# Patient Record
Sex: Male | Born: 1935 | Race: White | Hispanic: No
Health system: Southern US, Community
[De-identification: ages and names within clinical notes are randomized; demographics above are authoritative.]

---

## 2005-01-19 ENCOUNTER — Other Ambulatory Visit: Payer: Self-pay

## 2005-01-19 ENCOUNTER — Inpatient Hospital Stay: Payer: Self-pay | Admitting: Internal Medicine

## 2005-01-20 ENCOUNTER — Other Ambulatory Visit: Payer: Self-pay

## 2005-05-05 ENCOUNTER — Ambulatory Visit: Payer: Self-pay | Admitting: Cardiology

## 2006-08-06 IMAGING — CT CT OF THE RIGHT SHOULDER WITHOUT CONTRAST
1 series · 12 of 14 positions shown, 15 images · non-contrast
Comparison: none

REASON FOR EXAM: Right shoulder pain, status post pacemaker
COMMENTS:

[Series 2: inspace · axial · 0.44mm/px · z∈[-198,-72]mm · 12 of 188 slices shown, 15 images]
[im 15/188  soft-tissue]
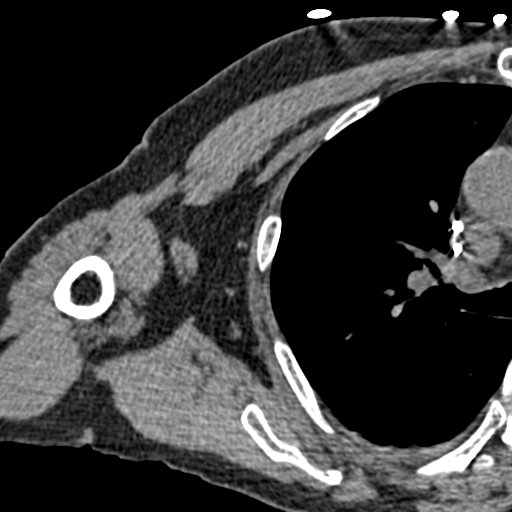
[im 15/188  bone]
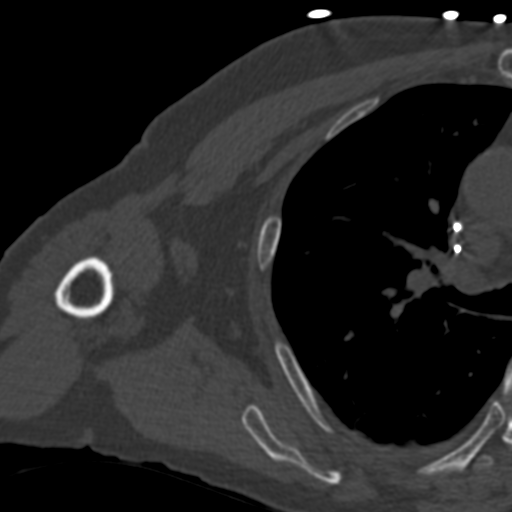
[im 29/188  bone]
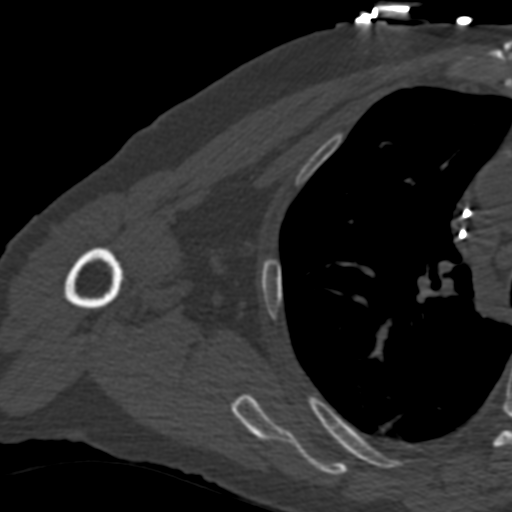
[im 44/188  bone]
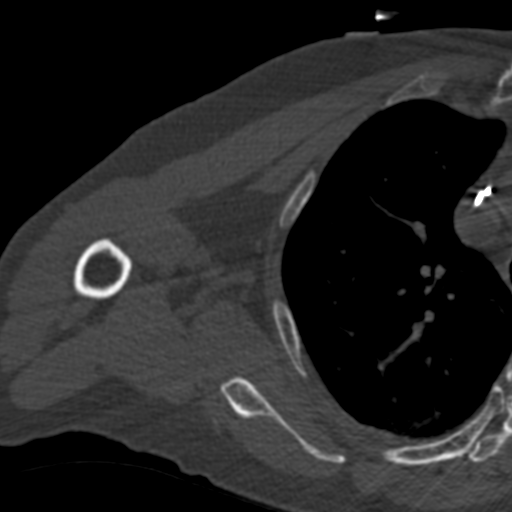
[im 58/188  bone]
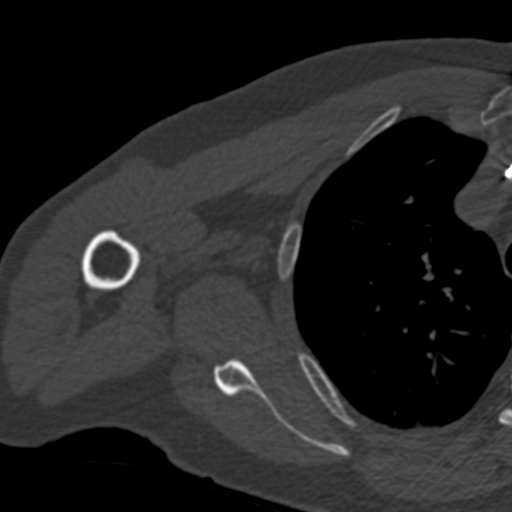
[im 72/188  soft-tissue]
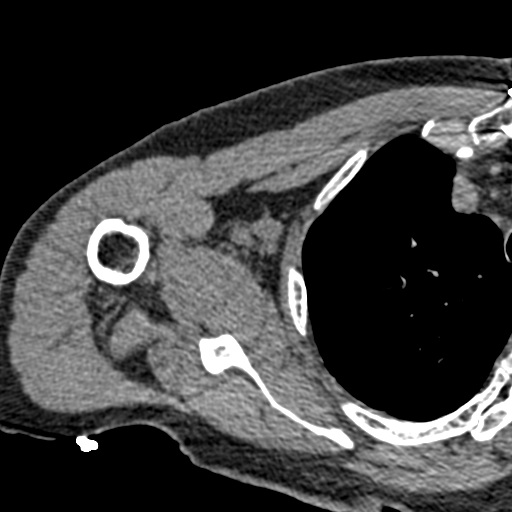
[im 72/188  bone]
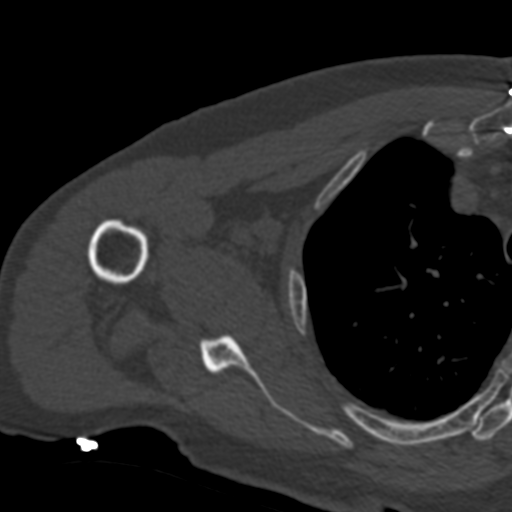
[im 87/188  bone]
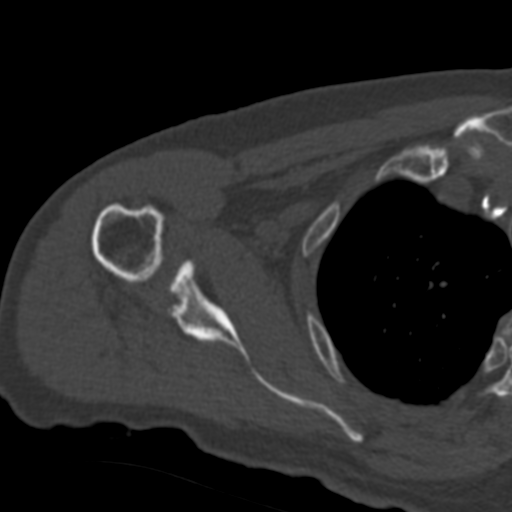
[im 101/188  bone]
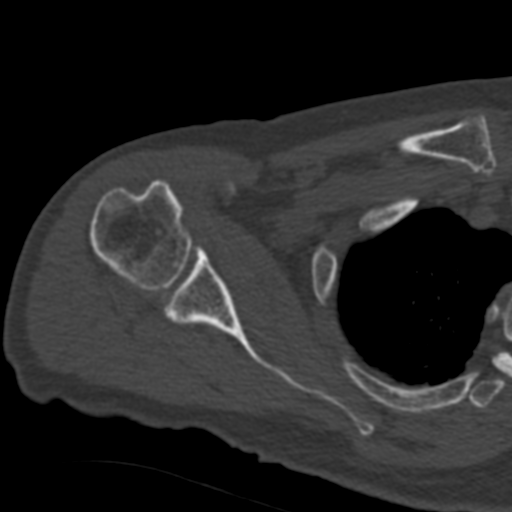
[im 116/188  bone]
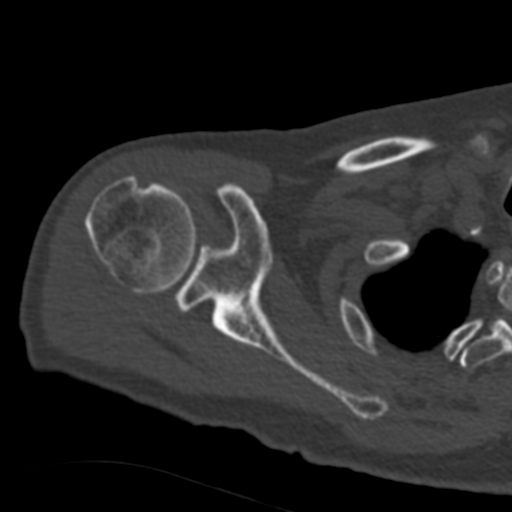
[im 130/188  soft-tissue]
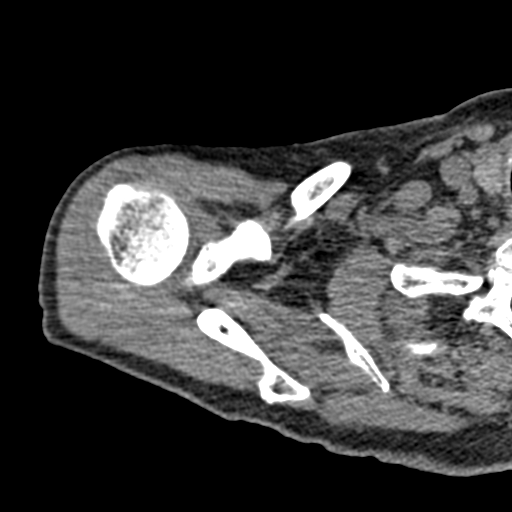
[im 130/188  bone]
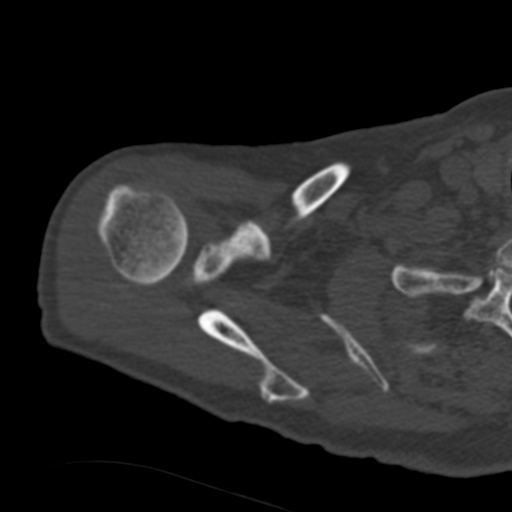
[im 144/188  bone]
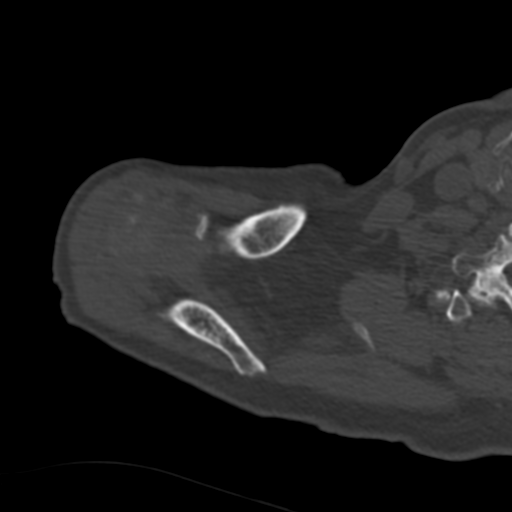
[im 159/188  bone]
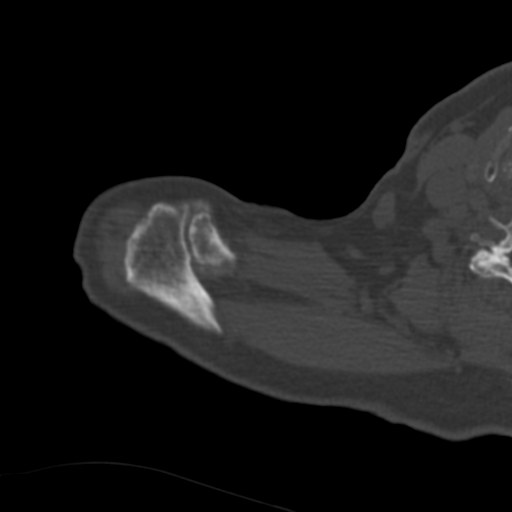
[im 173/188  bone]
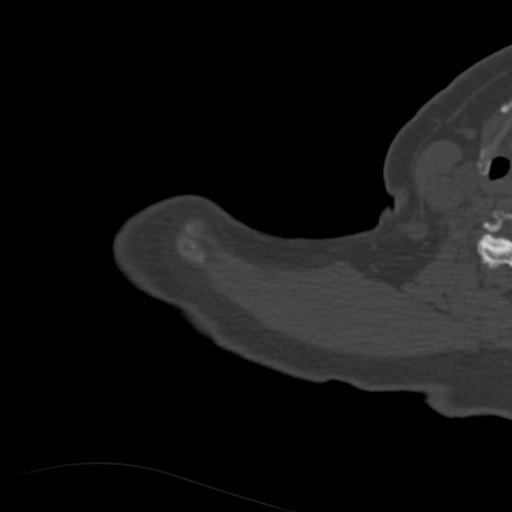

[12 of 14 positions shown; findings below may reference images not displayed]

PROCEDURE:     CT  - CT SHOULDER RIGHT WO  - January 20, 2005  [DATE]

RESULT:     Noncontrast CT of RIGHT shoulder, utilizing a multislice helical
acquisition with coronal, sagittal and axial reconstructions, is performed
utilizing bone window settings.

There are degenerative changes at the acromioclavicular joint. No definite
fracture or focal bony lesion is seen. There is no evidence of dislocation
of the humeral head from the glenoid. The clavicle appears to be intact. The
scapula appears to be normal. The adjacent ribs are unremarkable. There are
some areas of atelectasis versus linear fibrosis in the RIGHT lung.
IMPRESSION: Osteopenic and degenerative changes about the RIGHT shoulder
but no evidence of an acute bony abnormality. The possibility of a
supraspinatus tendon abnormality cannot be excluded by CT. Should the
patient's symptoms persist, shoulder arthrogram could be considered given
that the patient has a pacemaker and MR is not an option.

## 2006-11-17 ENCOUNTER — Ambulatory Visit: Payer: Self-pay | Admitting: Physician Assistant

## 2006-11-29 ENCOUNTER — Ambulatory Visit: Payer: Self-pay | Admitting: Unknown Physician Specialty

## 2006-12-23 ENCOUNTER — Ambulatory Visit: Payer: Self-pay | Admitting: Pain Medicine

## 2007-01-12 ENCOUNTER — Ambulatory Visit: Payer: Self-pay | Admitting: Pain Medicine

## 2007-12-08 ENCOUNTER — Ambulatory Visit: Payer: Self-pay | Admitting: General Surgery

## 2007-12-13 ENCOUNTER — Ambulatory Visit: Payer: Self-pay | Admitting: General Surgery

## 2010-07-03 ENCOUNTER — Ambulatory Visit: Payer: Self-pay | Admitting: Internal Medicine

## 2011-01-14 ENCOUNTER — Ambulatory Visit: Payer: Self-pay | Admitting: Internal Medicine

## 2011-07-20 ENCOUNTER — Ambulatory Visit: Payer: Self-pay | Admitting: Internal Medicine

## 2011-08-07 ENCOUNTER — Ambulatory Visit: Payer: Self-pay | Admitting: Internal Medicine

## 2011-08-18 ENCOUNTER — Ambulatory Visit: Payer: Self-pay | Admitting: Internal Medicine

## 2011-08-18 ENCOUNTER — Inpatient Hospital Stay: Payer: Self-pay | Admitting: Internal Medicine

## 2011-08-18 LAB — BASIC METABOLIC PANEL
Anion Gap: 18 — ABNORMAL HIGH (ref 7–16)
BUN: 66 mg/dL — ABNORMAL HIGH (ref 7–18)
Glucose: 91 mg/dL (ref 65–99)
Osmolality: 315 (ref 275–301)
Potassium: 3.9 mmol/L (ref 3.5–5.1)
Sodium: 149 mmol/L — ABNORMAL HIGH (ref 136–145)

## 2011-08-18 LAB — CBC CANCER CENTER
Basophil #: 0.1 x10 3/mm (ref 0.0–0.1)
Basophil %: 1 %
Eosinophil %: 5.7 %
HCT: 34.8 % — ABNORMAL LOW (ref 40.0–52.0)
Lymphocyte #: 0.9 x10 3/mm — ABNORMAL LOW (ref 1.0–3.6)
Lymphocyte %: 16.3 %
Monocyte %: 15 %
Neutrophil #: 3.2 x10 3/mm (ref 1.4–6.5)
Platelet: 106 x10 3/mm — ABNORMAL LOW (ref 150–440)
RDW: 18.1 % — ABNORMAL HIGH (ref 11.5–14.5)
WBC: 5.3 x10 3/mm (ref 3.8–10.6)

## 2011-08-18 LAB — URIC ACID: Uric Acid: 11.9 mg/dL — ABNORMAL HIGH (ref 3.5–7.2)

## 2011-08-18 LAB — RETICULOCYTES: Absolute Retic Count: 0.036 10*6/uL (ref 0.024–0.084)

## 2011-08-19 LAB — URINALYSIS, COMPLETE
Bilirubin,UR: NEGATIVE
Glucose,UR: NEGATIVE mg/dL (ref 0–75)
Hyaline Cast: 26
Ph: 5 (ref 4.5–8.0)
Specific Gravity: 1.017 (ref 1.003–1.030)
Squamous Epithelial: NONE SEEN

## 2011-08-19 LAB — COMPREHENSIVE METABOLIC PANEL
Anion Gap: 19 — ABNORMAL HIGH (ref 7–16)
BUN: 65 mg/dL — ABNORMAL HIGH (ref 7–18)
Bilirubin,Total: 1.3 mg/dL — ABNORMAL HIGH (ref 0.2–1.0)
Chloride: 118 mmol/L — ABNORMAL HIGH (ref 98–107)
Co2: 13 mmol/L — ABNORMAL LOW (ref 21–32)
Creatinine: 3.86 mg/dL — ABNORMAL HIGH (ref 0.60–1.30)
EGFR (African American): 19 — ABNORMAL LOW
Glucose: 68 mg/dL (ref 65–99)
Osmolality: 315 (ref 275–301)
Potassium: 3.9 mmol/L (ref 3.5–5.1)
SGPT (ALT): 21 U/L
Sodium: 150 mmol/L — ABNORMAL HIGH (ref 136–145)
Total Protein: 6.6 g/dL (ref 6.4–8.2)

## 2011-08-19 LAB — CBC WITH DIFFERENTIAL/PLATELET
Basophil #: 0.1 10*3/uL (ref 0.0–0.1)
Basophil %: 1.8 %
Eosinophil %: 5.7 %
Lymphocyte %: 15.7 %
MCH: 32.5 pg (ref 26.0–34.0)
Monocyte #: 0.6 10*3/uL (ref 0.0–0.7)
Monocyte %: 13.6 %
Neutrophil %: 63.2 %
Platelet: 100 10*3/uL — ABNORMAL LOW (ref 150–440)
WBC: 4.8 10*3/uL (ref 3.8–10.6)

## 2011-08-19 LAB — AMMONIA: Ammonia, Plasma: 84 mcmol/L — ABNORMAL HIGH (ref 11–32)

## 2011-08-19 LAB — PROT IMMUNOELECTROPHORES(ARMC)

## 2011-08-19 LAB — PROTIME-INR: INR: 1.3

## 2011-08-19 LAB — BILIRUBIN, DIRECT: Bilirubin, Direct: 0.7 mg/dL — ABNORMAL HIGH (ref 0.00–0.20)

## 2011-08-19 LAB — APTT: Activated PTT: 76.2 secs — ABNORMAL HIGH (ref 23.6–35.9)

## 2011-08-19 LAB — FERRITIN: Ferritin (ARMC): 105 ng/mL (ref 8–388)

## 2011-08-19 LAB — FOLATE: Folic Acid: 16.4 ng/mL (ref 3.1–100.0)

## 2011-08-20 LAB — BASIC METABOLIC PANEL
Anion Gap: 20 — ABNORMAL HIGH (ref 7–16)
BUN: 69 mg/dL — ABNORMAL HIGH (ref 7–18)
Chloride: 114 mmol/L — ABNORMAL HIGH (ref 98–107)
Co2: 13 mmol/L — ABNORMAL LOW (ref 21–32)
Creatinine: 4.13 mg/dL — ABNORMAL HIGH (ref 0.60–1.30)
EGFR (African American): 18 — ABNORMAL LOW
EGFR (Non-African Amer.): 15 — ABNORMAL LOW
Sodium: 147 mmol/L — ABNORMAL HIGH (ref 136–145)

## 2011-08-20 LAB — CBC WITH DIFFERENTIAL/PLATELET
Basophil %: 0.7 %
Eosinophil #: 0.2 10*3/uL (ref 0.0–0.7)
HGB: 10.8 g/dL — ABNORMAL LOW (ref 13.0–18.0)
Lymphocyte #: 1 10*3/uL (ref 1.0–3.6)
Lymphocyte %: 14 %
MCHC: 32.6 g/dL (ref 32.0–36.0)
Neutrophil %: 74.7 %
RBC: 3.28 10*6/uL — ABNORMAL LOW (ref 4.40–5.90)
WBC: 6.9 10*3/uL (ref 3.8–10.6)

## 2011-08-20 LAB — IRON AND TIBC
Iron Bind.Cap.(Total): 272 ug/dL (ref 250–450)
Iron: 82 ug/dL (ref 65–175)
Unbound Iron-Bind.Cap.: 190 ug/dL

## 2011-08-21 LAB — BASIC METABOLIC PANEL
Anion Gap: 19 — ABNORMAL HIGH (ref 7–16)
BUN: 55 mg/dL — ABNORMAL HIGH (ref 7–18)
Creatinine: 3.82 mg/dL — ABNORMAL HIGH (ref 0.60–1.30)
EGFR (Non-African Amer.): 16 — ABNORMAL LOW
Glucose: 96 mg/dL (ref 65–99)
Osmolality: 311 (ref 275–301)
Potassium: 3.5 mmol/L (ref 3.5–5.1)

## 2011-08-21 LAB — CBC WITH DIFFERENTIAL/PLATELET
Basophil #: 0 10*3/uL (ref 0.0–0.1)
Eosinophil #: 0.3 10*3/uL (ref 0.0–0.7)
HCT: 33.5 % — ABNORMAL LOW (ref 40.0–52.0)
HGB: 10.9 g/dL — ABNORMAL LOW (ref 13.0–18.0)
Lymphocyte %: 17.6 %
MCHC: 32.6 g/dL (ref 32.0–36.0)
Monocyte #: 0.8 10*3/uL — ABNORMAL HIGH (ref 0.0–0.7)
Monocyte %: 15 %
Neutrophil #: 3.1 10*3/uL (ref 1.4–6.5)
RDW: 18.4 % — ABNORMAL HIGH (ref 11.5–14.5)
WBC: 5.2 10*3/uL (ref 3.8–10.6)

## 2011-08-22 LAB — BASIC METABOLIC PANEL
Calcium, Total: 7.9 mg/dL — ABNORMAL LOW (ref 8.5–10.1)
Co2: 19 mmol/L — ABNORMAL LOW (ref 21–32)
EGFR (African American): 22 — ABNORMAL LOW
EGFR (Non-African Amer.): 18 — ABNORMAL LOW
Glucose: 98 mg/dL (ref 65–99)
Potassium: 3.5 mmol/L (ref 3.5–5.1)

## 2011-08-22 LAB — HEPATIC FUNCTION PANEL A (ARMC)
Alkaline Phosphatase: 256 U/L — ABNORMAL HIGH (ref 50–136)
SGPT (ALT): 21 U/L

## 2011-08-23 LAB — RENAL FUNCTION PANEL
Anion Gap: 14 (ref 7–16)
BUN: 27 mg/dL — ABNORMAL HIGH (ref 7–18)
Calcium, Total: 7.9 mg/dL — ABNORMAL LOW (ref 8.5–10.1)
Chloride: 104 mmol/L (ref 98–107)
Co2: 26 mmol/L (ref 21–32)
Creatinine: 2.74 mg/dL — ABNORMAL HIGH (ref 0.60–1.30)
EGFR (African American): 29 — ABNORMAL LOW
EGFR (Non-African Amer.): 24 — ABNORMAL LOW
Potassium: 3.4 mmol/L — ABNORMAL LOW (ref 3.5–5.1)
Sodium: 144 mmol/L (ref 136–145)

## 2011-08-23 LAB — HEMOGLOBIN: HGB: 11.3 g/dL — ABNORMAL LOW (ref 13.0–18.0)

## 2011-08-24 LAB — COMPREHENSIVE METABOLIC PANEL
Albumin: 2.1 g/dL — ABNORMAL LOW (ref 3.4–5.0)
Anion Gap: 15 (ref 7–16)
BUN: 33 mg/dL — ABNORMAL HIGH (ref 7–18)
Bilirubin,Total: 1.6 mg/dL — ABNORMAL HIGH (ref 0.2–1.0)
Calcium, Total: 8.2 mg/dL — ABNORMAL LOW (ref 8.5–10.1)
Co2: 27 mmol/L (ref 21–32)
EGFR (Non-African Amer.): 19 — ABNORMAL LOW
Glucose: 79 mg/dL (ref 65–99)
Osmolality: 291 (ref 275–301)
Potassium: 3.2 mmol/L — ABNORMAL LOW (ref 3.5–5.1)
Sodium: 143 mmol/L (ref 136–145)

## 2011-08-25 LAB — BASIC METABOLIC PANEL
BUN: 39 mg/dL — ABNORMAL HIGH (ref 7–18)
Chloride: 102 mmol/L (ref 98–107)
Co2: 27 mmol/L (ref 21–32)
Creatinine: 3.34 mg/dL — ABNORMAL HIGH (ref 0.60–1.30)
EGFR (African American): 23 — ABNORMAL LOW
Glucose: 113 mg/dL — ABNORMAL HIGH (ref 65–99)
Osmolality: 288 (ref 275–301)
Sodium: 139 mmol/L (ref 136–145)

## 2011-08-26 LAB — CBC WITH DIFFERENTIAL/PLATELET
Basophil #: 0 10*3/uL (ref 0.0–0.1)
Basophil %: 0.3 %
Eosinophil #: 0.2 10*3/uL (ref 0.0–0.7)
Eosinophil %: 2.1 %
HCT: 33.1 % — ABNORMAL LOW (ref 40.0–52.0)
HGB: 10.8 g/dL — ABNORMAL LOW (ref 13.0–18.0)
Lymphocyte #: 1.3 10*3/uL (ref 1.0–3.6)
MCH: 32.6 pg (ref 26.0–34.0)
MCHC: 32.7 g/dL (ref 32.0–36.0)
MCV: 100 fL (ref 80–100)
Monocyte #: 1.1 10*3/uL — ABNORMAL HIGH (ref 0.0–0.7)
Neutrophil #: 5.2 10*3/uL (ref 1.4–6.5)
Neutrophil %: 66.7 %
RBC: 3.32 10*6/uL — ABNORMAL LOW (ref 4.40–5.90)

## 2011-08-26 LAB — BASIC METABOLIC PANEL
Anion Gap: 14 (ref 7–16)
BUN: 41 mg/dL — ABNORMAL HIGH (ref 7–18)
Chloride: 100 mmol/L (ref 98–107)
Creatinine: 2.87 mg/dL — ABNORMAL HIGH (ref 0.60–1.30)
EGFR (African American): 27 — ABNORMAL LOW
Glucose: 82 mg/dL (ref 65–99)
Osmolality: 296 (ref 275–301)
Potassium: 3.2 mmol/L — ABNORMAL LOW (ref 3.5–5.1)
Sodium: 144 mmol/L (ref 136–145)

## 2011-08-26 LAB — PHOSPHORUS: Phosphorus: 1.9 mg/dL — ABNORMAL LOW (ref 2.5–4.9)

## 2011-08-27 LAB — BASIC METABOLIC PANEL
Anion Gap: 14 (ref 7–16)
Calcium, Total: 8.4 mg/dL — ABNORMAL LOW (ref 8.5–10.1)
Chloride: 100 mmol/L (ref 98–107)
Co2: 30 mmol/L (ref 21–32)
EGFR (African American): 26 — ABNORMAL LOW
EGFR (Non-African Amer.): 21 — ABNORMAL LOW
Glucose: 145 mg/dL — ABNORMAL HIGH (ref 65–99)
Osmolality: 299 (ref 275–301)
Potassium: 3.5 mmol/L (ref 3.5–5.1)

## 2011-09-07 ENCOUNTER — Ambulatory Visit: Payer: Self-pay | Admitting: Internal Medicine

## 2011-09-07 DEATH — deceased

## 2013-03-03 IMAGING — CR DG CHEST 1V PORT
1 series · 1 of 1 positions shown · non-contrast
Comparison: none

REASON FOR EXAM: edema - peripheral - shortness of breath - eval for
Pulmo edema
COMMENTS:

PROCEDURE:     DXR - DXR PORTABLE CHEST SINGLE VIEW  - August 18, 2011  [DATE]
RESULT:     Comparison: 01/19/2005

[ap]
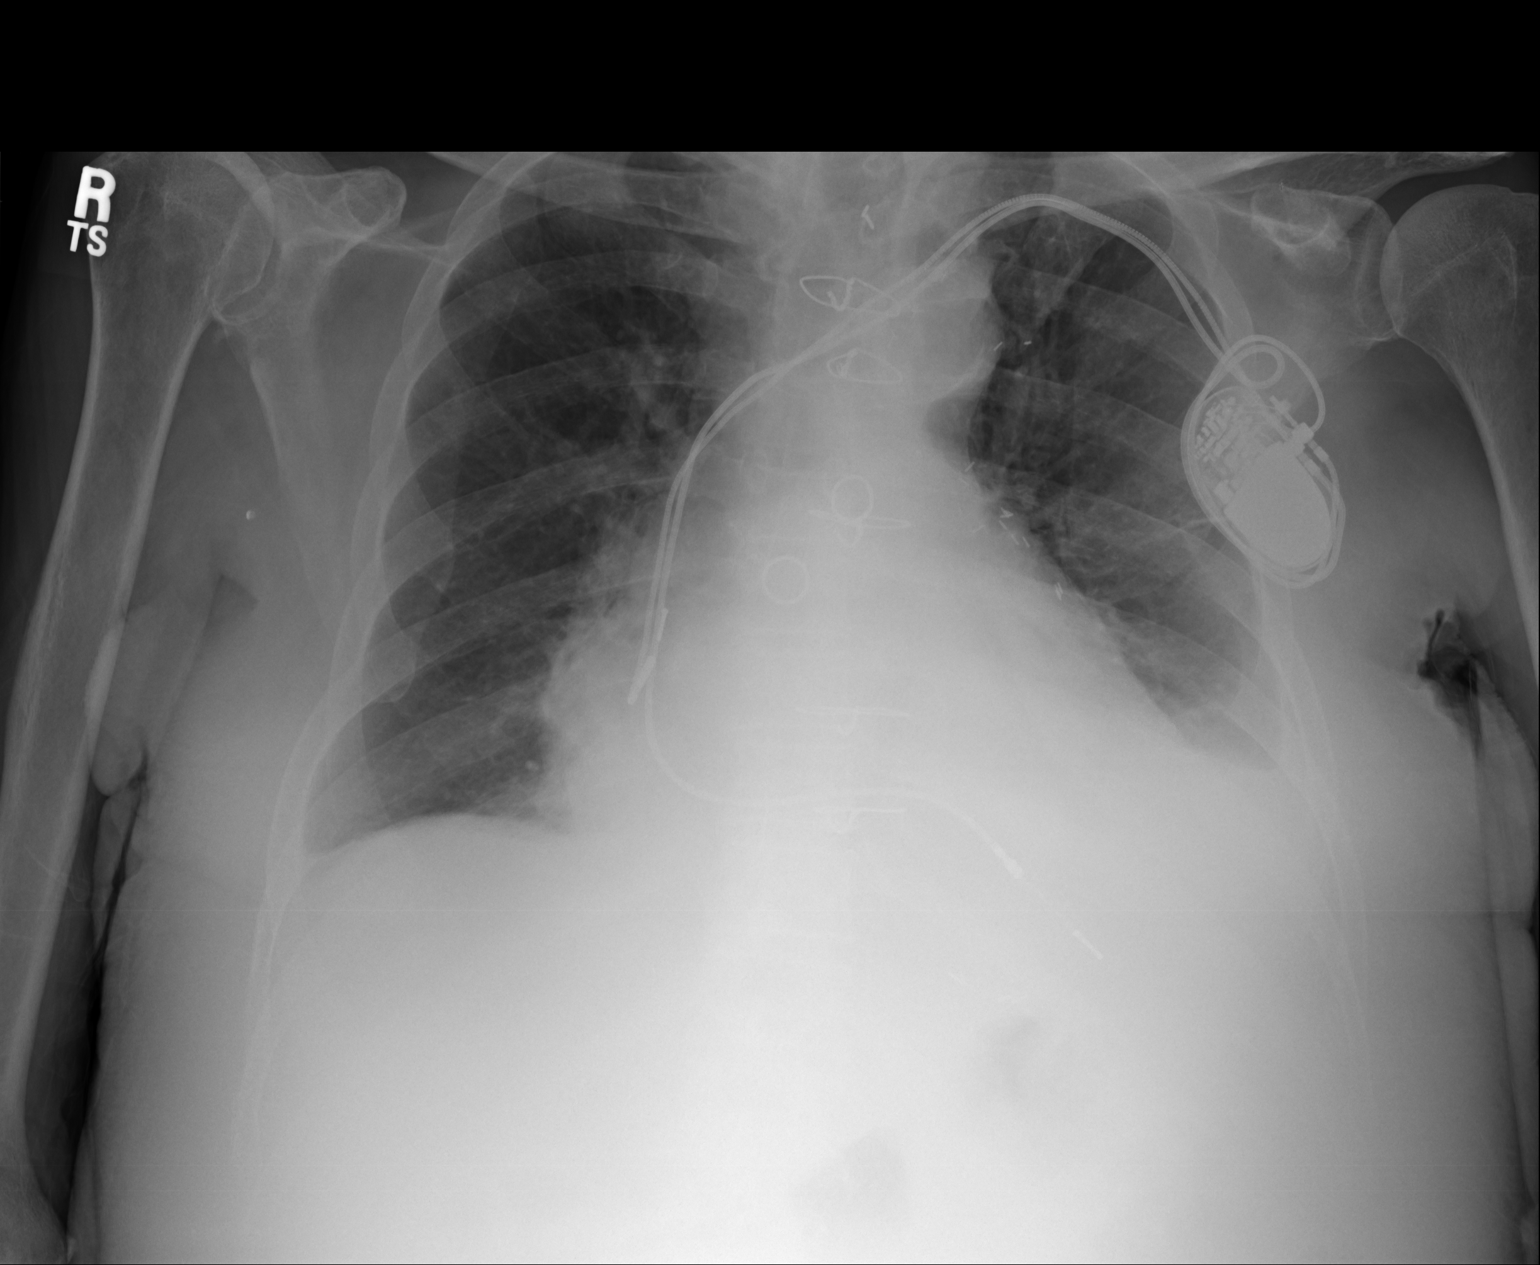

[1 of 1 positions shown; findings below may reference images not displayed]

FINDINGS: The heart is enlarged. Prior median sternotomy and CABG. Wires are seen from
dual-lead pacemaker. There is a small left pleural effusion. There is a mild
retrocardiac opacity.
IMPRESSION: 1. Small left pleural effusion.
2. Mild retrocardiac opacity may represent atelectasis. Infection is not
excluded. Followup PA and lateral chest radiographs are recommended to
ensure resolution.

## 2013-03-04 IMAGING — US ABDOMEN ULTRASOUND
1 series · 17 of 25 positions shown · non-contrast
Comparison: none

REASON FOR EXAM: renal failure, ascites
COMMENTS:

[Series 1: abdomen ultrasound · 17 of 163 slices shown]
[im 1/163]
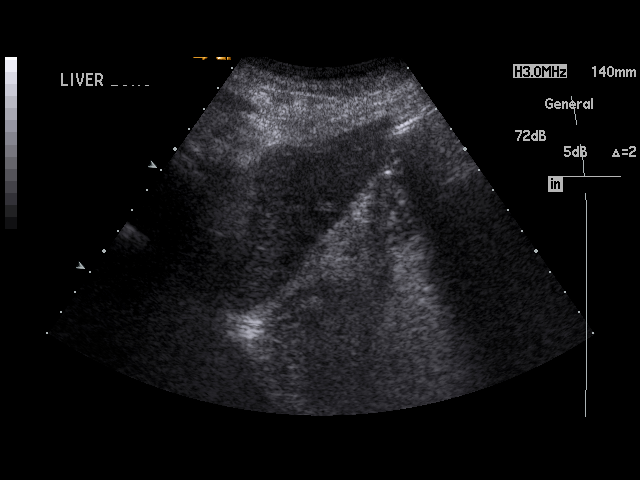
[im 14/163]
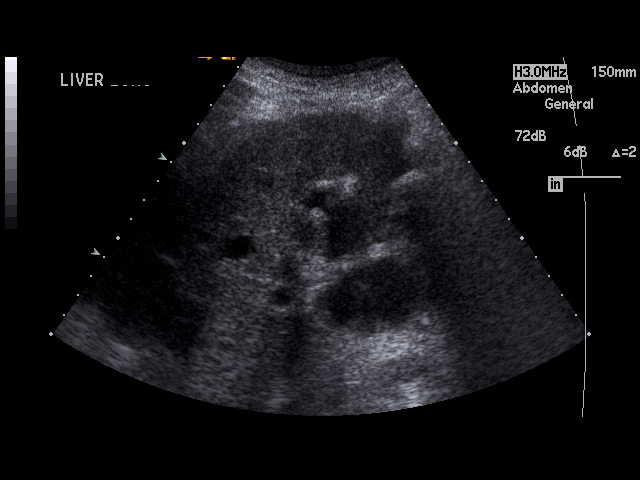
[im 21/163]
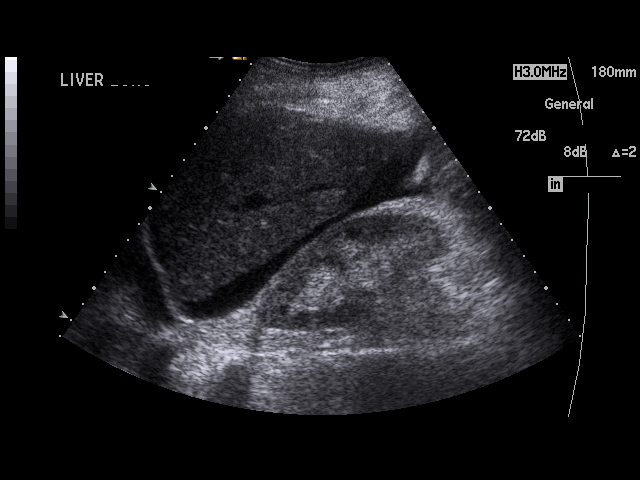
[im 34/163]
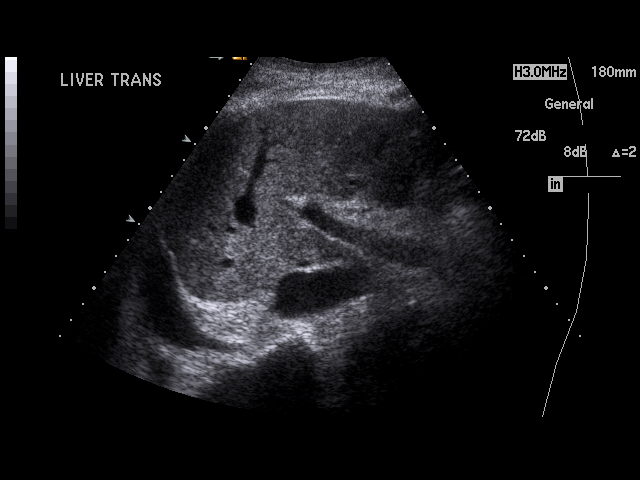
[im 41/163]
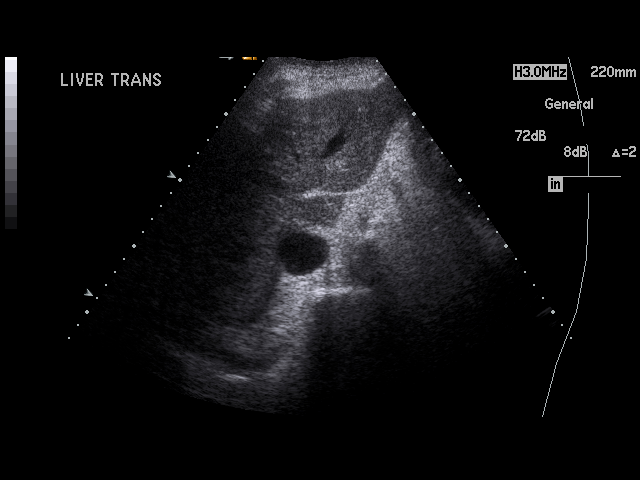
[im 55/163]
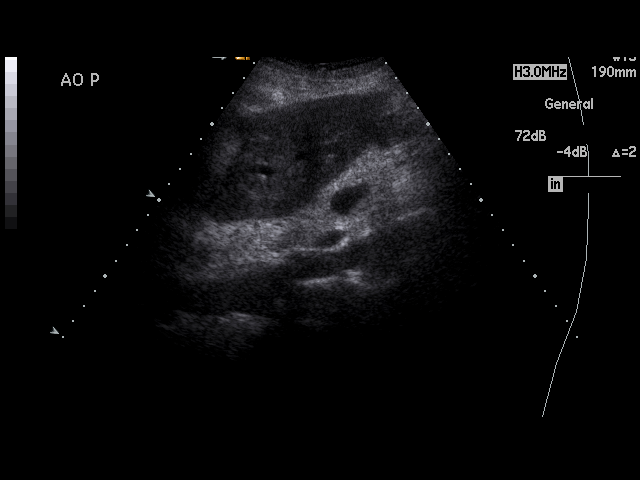
[im 61/163]
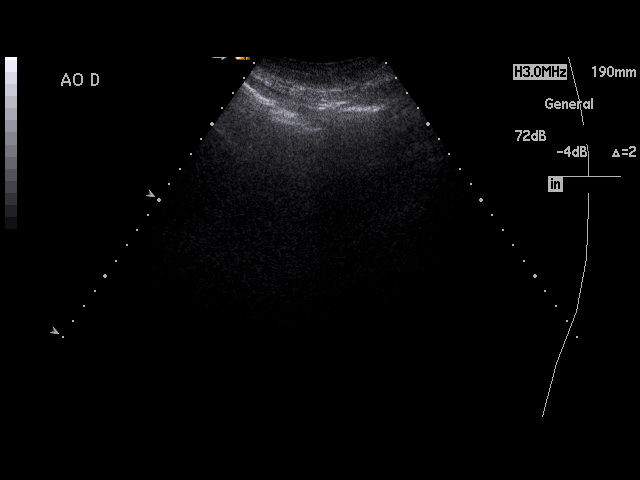
[im 75/163]
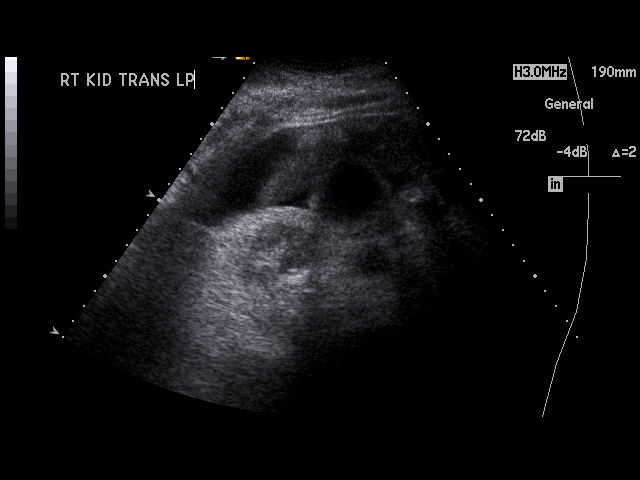
[im 82/163]
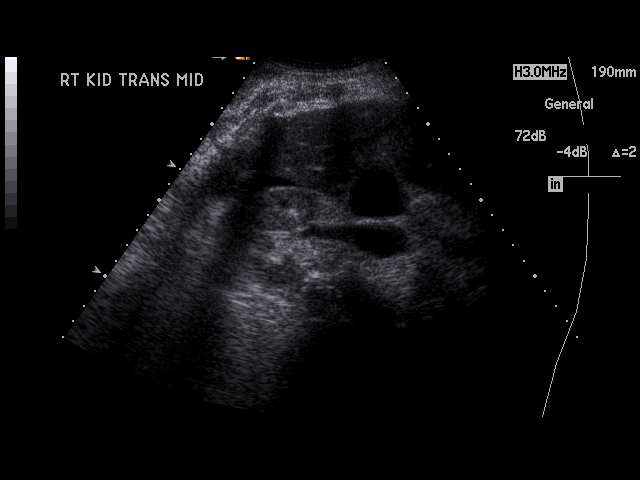
[im 88/163]
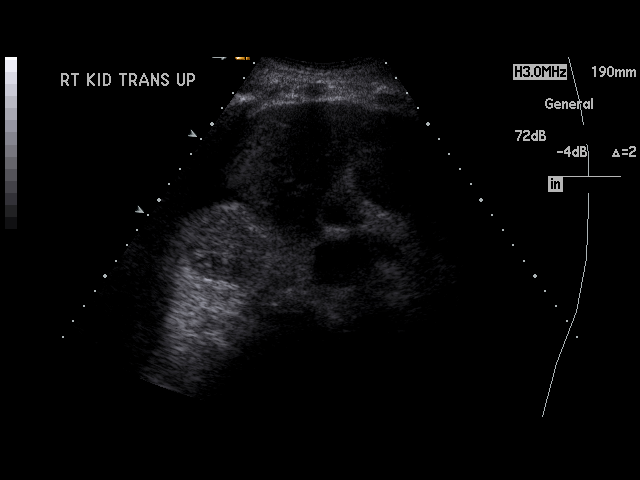
[im 102/163]
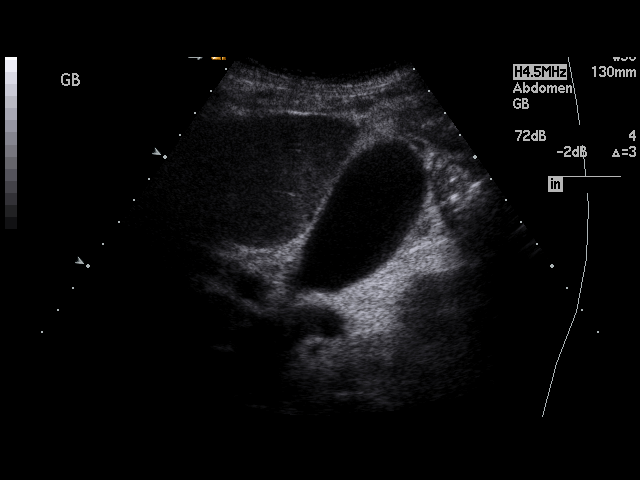
[im 109/163]
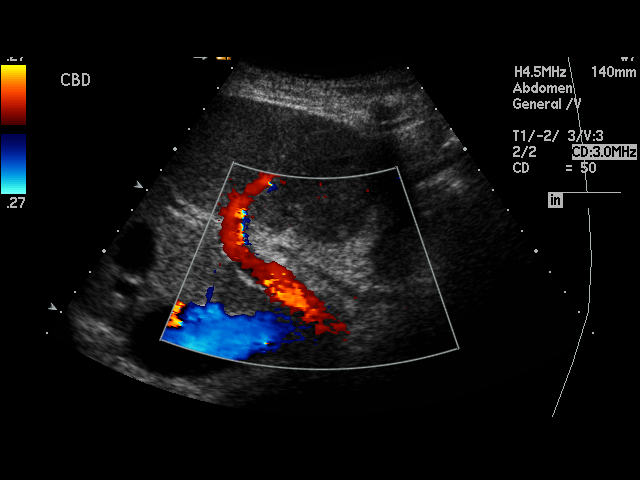
[im 122/163]
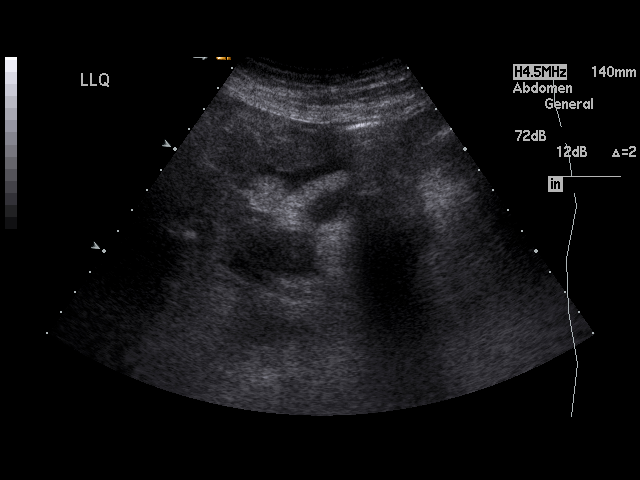
[im 129/163]
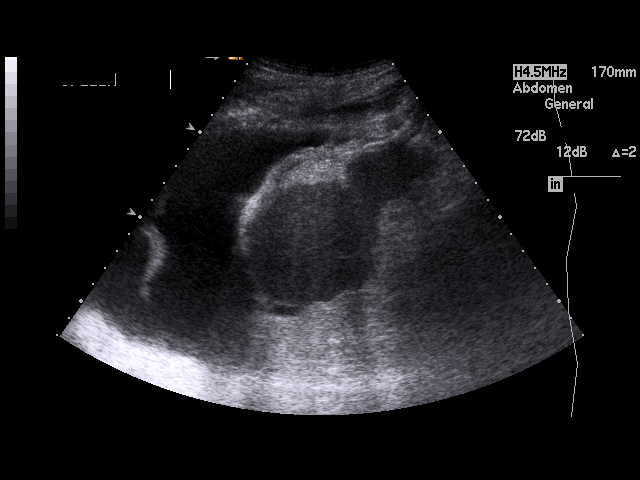
[im 142/163]
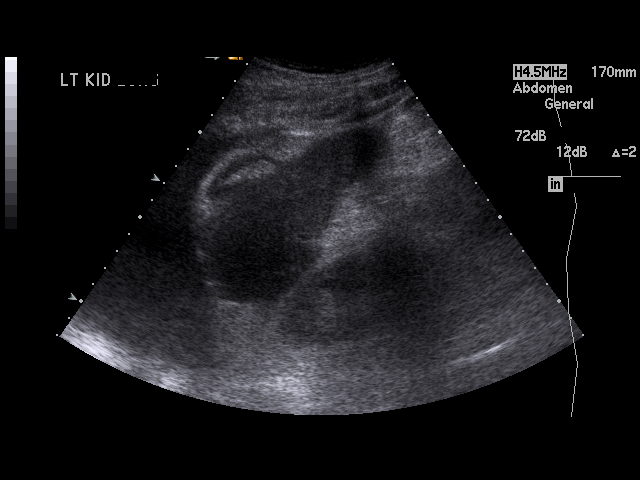
[im 149/163]
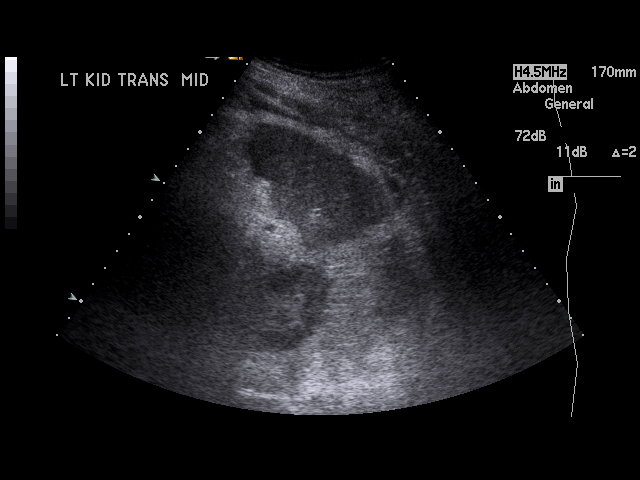
[im 163/163]
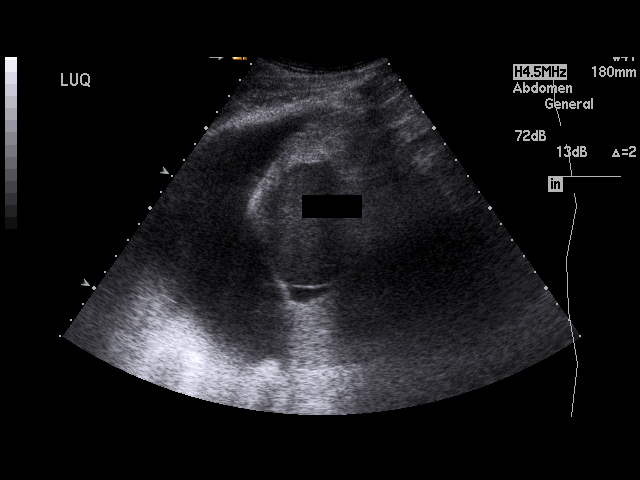

[17 of 25 positions shown; findings below may reference images not displayed]

PROCEDURE:     US  - US ABDOMEN GENERAL SURVEY  - August 19, 2011  [DATE]

RESULT:

No hepatic mass is identified. Portal vein flow is biphasic. This pattern
can be seen secondary to liver disease or to cardiac disease. There is a
small amount of ascites present. The spleen size is normal. The abdominal
aorta and inferior vena cava show no significant abnormalities. No
gallstones are seen. There is slight thickening of the gallbladder wall
which measures 3.4 mm in thickness. This is a nonspecific finding in a
patient with ascites. The common bile duct measures 4.1 mm in diameter which
is within normal limits. The kidneys show no hydronephrosis. Sagittally, the
right kidney measures 10.6 cm and the left measures 10.2 cm.

There are noted bibasilar pleural effusions.
IMPRESSION: 1.  There is observed a small amount of ascites.
2.  Bibasilar pleural effusions are present.
3.  No hepatic mass is seen.
4.  Portal vein flow is biphasic. This can be seen secondary to hepatic
disease or cardiac disease.
5.  No gallstones are seen.
6.  The kidneys show no hydronephrosis or other acute change.

## 2014-09-30 NOTE — Consult Note (Signed)
Present Illness 81 year old male with known coronary artery disease, , status post coronary artery bypass graft.  He also has sick sinus syndrome status post pacemaker placement, hypertension, hyperlipidemia and peripheral vascular disease with carotid atherosclerosis.  The patient has had some chronic kidney disease with a creatinine with a creatinine review waxing and waning over the last several months.  His creatinine in December was 2.2.  The patient additionally has had some mild anemia, worsening over the last several weeks.  Hemoglobin has dropped from a high 12-10.6.  The patient additionally had previously had normal thyroid with now a thyroid-stimulating hormone of 19.8.  The patient has had some significant lower extremity edema, shortness of breath previously the patient has had normal pacemaker function, but has had some concerns of atrial fibrillation.  Current EKG shows ventricular pacing but cannot assess significance of atrial rhythm.  The patient has peripheral vascular disease with previous TIA and previously on antiplatelets. lipid management has been stable on lipid management including Zocor.  The patient now it is submitted for significant symptoms of congestive heart failure with this lower extremity edema and shortness of breath.  Echocardiogram has shown mild LV systolic is 1 function with valvular heart disease which appears to be not changed since previous echocardiogram in late 2012.  He does have a CT scan showing bilateral pleural effusions with cardiomegaly but no evidence of pulmonary embolism.  Currently his symptoms suggest that he has vascular congestion of his liver and congestive heart failure but this is multifactorial in nature, including the possibility of renal failure, thyroid disease, LV systolic dysfunction, valvular heart disease, anemia.  Family history Multiple family members with cardiovascular disease at an early age  Social history The patient currently  denies alcohol or tobacco use but there is a significant history of this   Physical Exam:   GEN WD    HEENT pink conjunctivae    NECK No masses    RESP wheezing  crackles    CARD Murmur    Murmur Systolic    Systolic Murmur Out flow  axilla    ABD denies tenderness  soft    LYMPH negative neck    EXTR positive edema    SKIN positive ulcers    NEURO cranial nerves intact    PSYCH alert   Review of Systems:   Subjective/Chief Complaint patient says he is very short of breath    Respiratory: Short of breath    Cardiovascular: Tightness    Review of Systems: All other systems were reviewed and found to be negative    Medications/Allergies Reviewed Medications/Allergies reviewed     Hypercholesterolemia:    Hypertension:    Pacemaker:        Admit Diagnosis:   ACUTE RENAL FAILURE: 20-Aug-2011, Active, ACUTE RENAL FAILURE      Admit Reason:   Acute renal failure: (584.9) Active, ICD9, Acute kidney failure, unspecified  Home Medications: Medication Instructions Status  aspirin 81 mg oral tablet 1 tab(s) orally once a day  Active  Centrum oral tablet 1 tab(s) orally once a day Active  metoprolol tartrate 50 mg oral tablet 1 tab(s) orally once a day. **rx written for 1 tab orally twice daily but primary md decrease to 1 tab once a day** Active  zolpidem 10 mg oral tablet 1/2 to 1 tab(s) orally once a day (at bedtime) as needed.  **rx written for 1 tab orally once a day (at bedtime) as needed** Active  levothyroxine 25 mcg (  0.025 mg) oral tablet 1 tab(s) orally once a day Active  Aleve 220 mg oral tablet 2 tab(s) orally as needed. Active   Thyroid:  12-Mar-13 23:19    Thyroid Stimulating Hormone 19.80  Routine Chem:  09-NAT-55 73:22    Folic Acid, Serum 02.5  Routine Coag:  12-Mar-13 23:19    Prothrombin 16.7   INR 1.3   Activated PTT (APTT) 76.2  Routine Hem:  13-Mar-13 04:20    WBC (CBC) 4.8   RBC (CBC) 3.27   Hemoglobin (CBC) 10.6   Hematocrit  (CBC) 32.5   Platelet Count (CBC) 100   MCV 100   MCH 32.5   MCHC 32.6   RDW 18.2   Neutrophil % 63.2   Lymphocyte % 15.7   Monocyte % 13.6   Eosinophil % 5.7   Basophil % 1.8   Neutrophil # 3.1   Lymphocyte # 0.7   Monocyte # 0.6   Eosinophil # 0.3   Basophil # 0.1  Routine Chem:  13-Mar-13 04:20    Glucose, Serum 68   BUN 65   Creatinine (comp) 3.86   Sodium, Serum 150   Potassium, Serum 3.9   Chloride, Serum 118   CO2, Serum 13   Calcium (Total), Serum 8.4  Hepatic:  13-Mar-13 04:20    Bilirubin, Total 1.3   Alkaline Phosphatase 214   SGPT (ALT) 21   SGOT (AST) 49   Total Protein, Serum 6.6   Albumin, Serum 2.2  Routine Chem:  13-Mar-13 04:20    Osmolality (calc) 315   eGFR (African American) 19   eGFR (Non-African American) 16   Anion Gap 19  Hepatic:  13-Mar-13 04:20    Bilirubin, Direct 0.7  Routine Chem:  13-Mar-13 04:20    Ferritin ([facility]) 105  Routine UA:  13-Mar-13 08:09    Color (UA) Amber   Clarity (UA) Hazy   Glucose (UA) Negative   Bilirubin (UA) Negative   Ketones (UA) Trace   Specific Gravity (UA) 1.017   Blood (UA) 1+   pH (UA) 5.0   Protein (UA) 30 mg/dL   Nitrite (UA) Negative   Leukocyte Esterase (UA) 1+   RBC (UA) 9 /HPF   WBC (UA) 35 /HPF   Bacteria (UA) TRACE   Mucous (UA) PRESENT   Hyaline Cast (UA) 26 /LPF  Lab:  13-Mar-13 10:50    pH (ABG) 7.29   PCO2 21   PO2 64   FiO2 21   Base Excess -14.5   HCO3 10.1   O2 Saturation 91.9   O2 Device room air   Specimen Type (ABG) ARTERIAL   Patient Temp (ABG) 37.0  Routine Chem:  13-Mar-13 15:50    Ammonia, Plasma 84  Routine Hem:  14-Mar-13 04:36    WBC (CBC) 6.9   RBC (CBC) 3.28   Hemoglobin (CBC) 10.8   Hematocrit (CBC) 33.1   Platelet Count (CBC) 106   MCV 101   MCH 32.8   MCHC 32.6   RDW 19.5   Neutrophil % 74.7   Lymphocyte % 14.0   Monocyte % 7.9   Eosinophil % 2.7   Basophil % 0.7   Neutrophil # 5.1   Lymphocyte # 1.0   Monocyte # 0.5   Eosinophil #  0.2   Basophil # 0.0  Routine Chem:  14-Mar-13 04:36    Glucose, Serum 97   BUN 69   Creatinine (comp) 4.13   Sodium, Serum 147   Potassium, Serum 4.1  Chloride, Serum 114   CO2, Serum 13   Calcium (Total), Serum 8.3   Osmolality (calc) 312   eGFR (African American) 18   eGFR (Non-African American) 15   Anion Gap 20   Ferritin ([facility]) 100   Iron Binding Capacity (TIBC) 272   Phosphorus, Serum 6.0   EKG:   EKG Interp. by me    Interpretation pacemaker, ventricular rate    No Known Allergies:   Vital Signs/Nurse's Notes: **Vital Signs.:   14-Mar-13 06:16   Vital Signs Type Routine   Temperature Temperature (F) 97   Celsius 36.1   Temperature Source oral   Pulse Pulse 65   Pulse source per Dinamap   Respirations Respirations 18   Systolic BP Systolic BP 70   Diastolic BP (mmHg) Diastolic BP (mmHg) 44   Mean BP 52   BP Source Dinamap   Pulse Ox % Pulse Ox % 96   Pulse Ox Activity Level  At rest   Oxygen Delivery Room Air/ 21 %     Impression 81 year old male with known cardiovascular disease, status post coronary artery bypass graft, sick sinus syndrome status post pacemaker placement, hypertension, hyperlipidemia, renal insufficiency, anemia with pleural effusions and significant wheezing and lower extremity edema, multifactorial in nature consistent with congestive heart failure    Plan 1.  Treatment of chronic kidney disease and diuresis if able, as per nephrology. 2.  No cardiac intervention at this time due to no evidence of acute myocardial infarction with significant other medical management issues at high risk for cardiac intervention. 3.  Continue metoprolol for mild LV systolic dysfunction and heart rate control. 4.  Further treatment of anemia, thyroid disease as contributing to his current condition of congestive heart failure and illness. 5.  No thoracentesis due to pleural effusion, 2 small 6.  Further treatment of possible COPD and/or long issues due  to the fact that he has significant wheezing. 7.  Possible pacemaker interrogation and further treatment options   Electronic Signatures: Corey Skains (MD)  (Signed 14-Mar-13 12:24)  Authored: General Aspect/Present Illness, History and Physical Exam, Review of System, Past Medical History, Health Issues, Home Medications, Labs, EKG , Allergies, Vital Signs/Nurse's Notes, Impression/Plan   Last Updated: 14-Mar-13 12:24 by Corey Skains (MD)

## 2014-09-30 NOTE — Consult Note (Signed)
Pt seen and examined. See C. London's notes. Pt with anasarca. Active drinker. LFT abnormal. At least no obvious evidence of cirrhosis. Not enough ascites to drain. However, likely has alcoholic liver disease. Other liver serologies ordered. May eventually need liver bx. Will follow. Thanks.   Electronic Signatures: Lutricia Feilh, Alva Kuenzel (MD) (Signed on 13-Mar-13 15:38)  Authored   Last Updated: 13-Mar-13 15:39 by Lutricia Feilh, Wanya Bangura (MD)

## 2014-09-30 NOTE — Op Note (Signed)
PATIENT NAME:  [name], Walter J MR#:  Perry DATE OF BIRTH:  1925-08-06  DATE OF PROCEDURE:  08/20/2011  PREOPERATIVE DIAGNOSES:  1. Acute renal failure with worsening renal function requiring dialysis.  2. Hypertension.   POSTOPERATIVE DIAGNOSES:  1. Acute renal failure with worsening renal function requiring dialysis.  2. Hypertension.   PROCEDURES:  1. Ultrasound guidance for vascular access, right femoral vein.  2. Placement of right femoral Trialysis-type dialysis catheter.   SURGEON: Annice NeedyJason S. Dew, MD  ANESTHESIA: Local.   ESTIMATED BLOOD LOSS: Minimal.   INDICATION FOR PROCEDURE: 81 year old white male with worsening acute renal failure now requires dialysis. We are asked to place a dialysis catheter. Risks and benefits were discussed with his family as he is unable to provide his own consent and informed consent was obtained.  DESCRIPTION OF PROCEDURE: Patient was laid flat in his floor bed. His right groin was sterilely prepped and draped, a sterile surgical field was created. The right femoral vein was visualized with ultrasound and found to be patent. It was then accessed under direct ultrasound guidance without difficulty with a Seldinger needle. J-wire was placed after skin nick and dilatation. The Trialysis-type dialysis catheter 30 cm in length was placed over the wire and the wire was removed. All three ports withdrew dark red nonpulsatile blood and flushed easily with sterile saline.      It was secured to the skin with three nylon sutures and a sterile dressing was placed.   ____________________________ Annice NeedyJason S. Dew, MD jsd:cms D: 08/20/2011 16:41:33 ET T: 08/20/2011 17:17:19 ET JOB#: [id number]  cc: Annice NeedyJason S. Dew, MD, <Dictator> Annice NeedyJASON S DEW MD ELECTRONICALLY SIGNED 08/21/2011 16:27

## 2014-09-30 NOTE — H&P (Signed)
PATIENT NAME:  [name], [name]#:  [id number] DATE OF BIRTH:  [birth year]  DATE OF ADMISSION:  08/18/2011  PRIMARY CARE PHYSICIAN: Dr. Conchita Parison Chaplin    CHIEF COMPLAINT: Decreased urine output.   HISTORY OF PRESENT ILLNESS: The patient is an 81 year old male who has had progressive whole body edema over the last several months, complicated by anemia and thrombocytopenia and progressive renal failure. He has been evaluated by Dr. Lorre NickGittin and evaluated by Dr. Cherylann RatelLateef. He has been noted to have creatinine approximately 2 about a month ago. His platelet count reached a low of 65, actually had improved more recently. He underwent CT scanning last month which revealed ascites, abdominal wall edema, scrotal edema, but without clear etiology. He has history of some valvular heart disease with moderate mitral and tricuspid regurgitation, and has been evaluated by Cardiology as well. He had noticed that he has been making less urine output over the last several days. He's also noticed a diffuse rash, though this appears to have dated back several months and has been seen by Dermatology. It is challenging to put the chronicity of his complaints into correct order. His family members report that he has just deteriorated over the last several months, now no longer ambulatory. He was noted to have a creatinine of 4. He is admitted now with acute renal failure as well as whole body edema.   PAST MEDICAL HISTORY:  1. Coronary artery disease with coronary artery bypass in 1981.  2. History of dual pacemaker placement in 1998.  3. Hyperlipidemia.  4. Prostatic hypertrophy.  5. Gout.  6. Anemia with history of thrombocytopenia with previous Oncology evaluation. 7. Chronic kidney disease, stage III to IV. [facility].  8. History of skin cancer.  9. History of right inguinal hernia repair.  10. Valvular heart disease. Echocardiogram November 2012 revealing LVEF 50% with  moderate mitral and tricuspid regurgitation.   ALLERGIES: No known drug allergies.   MEDICATIONS:  1. Allopurinol 300 mg p.o. daily, recently held.  2. Simvastatin 40 mg p.o. at bedtime, recently held.  3. Enteric-coated aspirin 81 mg p.o. daily.  4. Lopressor 50 mg p.o. b.i.d.  5. Levothyroxine 0.025 mg p.o. daily.  6. Flonase two sprays each nostril daily.  7. Kenalog topically as needed.  8. Lasix 20 mg p.o. b.i.d., recently reduced.  9. Iron daily.   SOCIAL HISTORY: Remote tobacco. He reports that he drinks about a fifth of liquor per weekend.   FAMILY HISTORY: Coronary artery disease.   REVIEW OF SYSTEMS: Please see history of present illness. No fevers, chills, night sweats. Poor sleep. No chest pain. Some shortness of breath when he lies down. Diffuse rash with itching without abdominal pain, nausea. Remainder of complete review of systems is negative.   PHYSICAL EXAMINATION:   VITAL SIGNS: Pulse 62, blood pressure 100/50, saturation 93% on room air. Weight 66 kg.   GENERAL: Elderly male, in no distress.    HEENT: Pupils round and reactive to light. Lids and conjunctivae unremarkable.   EARS, NOSE, AND THROAT: External examination unremarkable. The oropharynx is moist without lesions.   NECK: Supple. Trachea midline. No thyromegaly.   CARDIOVASCULAR: Regular rate and rhythm without gallops or rubs. 2/6 systolic murmur toward the apex. Postoperative changes with pacer in place.   LUNGS: Crackles are heard at the bases without wheeze or retractions.   ABDOMEN: Thickening of the abdominal wall with hyperpigmentation, otherwise nondistended. Positive bowel sounds without guard or rebound.  SKIN: Diffuse hyperpigmentation and thickening is noted throughout the abdominal wall extending down both legs with some scaling. Telangiectasias are seen over the face.   LYMPH NODES: No cervical or supraclavicular nodes.   MUSCULOSKELETAL: No clubbing or cyanosis. There is brawny  edema extending above the knees bilaterally.   NEUROLOGIC: Cranial nerves appear intact. He does move all extremities with generalized leg weakness.   LABORATORY, DIAGNOSTIC, AND RADIOLOGICAL DATA: CT February 2013 revealed ascites with pleural effusions, hydroceles bilaterally, inguinal hernias which did not appear to be incarcerated. Chest x-ray today reveals small left pleural effusion with atelectasis. Uric acid 12. BUN 66, creatinine 4.15, sodium 149. Direct Coombs is negative. LDH 297. Total bilirubin 1.3. White count 5, hematocrit 35, platelets 106 with elevated MCV.   IMPRESSION AND PLAN:  1. Acute on chronic renal failure. He appears to have some degree of anasarca, unclear whether this is progressive, but now has developed what he describes as low output renal failure. Will ask Nephrology to see the patient. He has had extensive work-up in the office recently, which included SPEP, UPEP, ferritin levels, hepatitis B and C serologies, RPR, complement levels, parathyroid hormone levels, antiglomerular basement membrane evaluation, and all of this was essentially unrevealing. ANCA was also negative. Will have Nephrology see him to determine whether he needs dialysis and the family is aware that this may be the next step.  2. Elevated liver enzymes. He has markedly elevated alkaline phosphatase and this has been evaluated with isoenzymes. It appears to be of liver origin raising the possibility of primary sclerosing cholangitis or, less likely, a primary biliary cirrhosis. He does have some itching and he certainly has hyperpigmentation, and so will check anti-smooth muscle antibody, antimitochondrial antibody. Again, ANA has been negative and ferritin was normal. Will check cortisol and TSH as well to further investigate.  3. Coronary artery disease. I would hold aspirin given his renal failure and holding cholesterol medications given what appears to be ascites and possible cirrhosis. Reduce  Lopressor and follow. Avoid diuretics secondary to his renal failure.  4. Alcohol use. Will place on folate, multivitamin, thiamine. Watch for any evidence of withdrawal. Certainly alcohol toxicity may have contributed to his pancytopenia and if he does have cirrhosis this certainly would contribute as well. We will await his PT, PTT.  5. CODE STATUS. Discussed this with the patient and family members. He has no thoughts on resuscitation and remains FULL CODE.   ____________________________ Lynnea Ferrier, MD bjk:drc D: 08/18/2011 20:28:12 ET T: 08/19/2011 06:22:35 ET JOB#: [id number]  cc: Lynnea Ferrier, MD, <Dictator> Jimmie Molly. Candelaria Stagers, MD Daniel Nones MD ELECTRONICALLY SIGNED 08/24/2011 13:13

## 2014-09-30 NOTE — Consult Note (Signed)
PATIENT NAME:  [name], [name] MR#:  [id number] DATE OF BIRTH:  11-08-1925  DATE OF CONSULTATION:  08/22/2011  REFERRING PHYSICIAN:   CONSULTING PHYSICIAN:  Lamar BlinksBruce Perry. Niyla Marone, MD  DAILY PROGRESS NOTE   SUBJECTIVE: The patient is still somewhat lethargic this morning, although activity levels have been improved since dialysis.   PHYSICAL EXAMINATION: Unchanged from yesterday.   ORDERS: Reviewed and updated.   LABORATORY RESULTS: Reviewed.  RADIOLOGY RESULTS: Reviewed.   VITAL SIGNS: Reviewed.   ASSESSMENT: Known cardiovascular disease with sick sinus syndrome status post pacemaker placement, valvular heart disease, mild LV systolic dysfunction with acute hepatic congestion and elevated liver enzymes as well as renal failure causing confusion and treatment with dialysis is improving.   PLAN:  1. No further cardiac intervention at this time due to no significant myocardial infarction or angina.  2. Mild congestive heart failure likely secondary to renal failure with improvements overnight with dialysis.   ____________________________ Lamar BlinksBruce Perry. Keshon Markovitz, MD bjk:ap D: 08/23/2011 08:07:43 ET             T: 08/23/2011 14:21:52 ET JOB#: [id number] cc: Lamar BlinksBruce Perry. Tyger Wichman, MD, <Dictator> Lamar BlinksBRUCE Perry Tambra Muller MD ELECTRONICALLY SIGNED 08/25/2011 14:05

## 2014-09-30 NOTE — Consult Note (Signed)
Brief Consult Note: Diagnosis: anasacra.   Patient was seen by consultant.   Consult note dictated.   Discussed with Attending MD.   Comments: Appreciate consult for 81 y/o caucasian man admitted for anasarca. History of increasing renal insufficiency, MR/TR, pacemaker placement, and CABG noted. Also noted regular etoh consumption and elevated ALP, total bilirubin, and mild elevation of AST. ASMA, AMA, ceruloplasmin results pending. Patient is pleasant, but somewhat confused and a poor historian.  Impression: Elevated liver enzymes: may be related to etoh, but also concerning for cholestatic obstruction, possible cirrhosis. Will order NH4 and await pending labs. Further rec's to follow, may need liver biopsy. With questionable blood in stool last month, will send stool for occult blood.  Electronic Signatures: Vevelyn PatLondon, Prerna Harold H (NP)  (Signed 13-Mar-13 15:09)  Authored: Brief Consult Note   Last Updated: 13-Mar-13 15:09 by Keturah BarreLondon, Braelyn Bordonaro H (NP)

## 2014-09-30 NOTE — Consult Note (Signed)
PATIENT NAME:  [name], [name] MR#:  [id number] DATE OF BIRTH:  04/02/1926  DATE OF CONSULTATION:  08/19/2011  CONSULTING PHYSICIAN:  Keturah Barrehristiane H. Excell Neyland, NP  PRIMARY CARE PHYSICIAN: Jimmie Mollyon C. Candelaria Stagershaplin, MD    HISTORY OF PRESENT ILLNESS: Mr. [name] is an 81 year old Caucasian male who has been admitted with acute renal failure. He has a significant history of coronary artery disease with coronary artery bypass grafting, dual pacemaker placement, anemia with history of thrombocytopenia, hyperlipidemia, chronic kidney disease, stage 3 to 4, valvular heart disease with moderate mitral and tricuspid regurgitation. GI has been consulted at the request of Dr. Graciela HusbandsKlein due to anasarca. In speaking with Mr. [name], he is somewhat confused and he is a poor historian. He states the reason for his admission is due to a red rash. In regards to his edema, he does not really have anything to say about it. From a GI perspective, he denies abdominal pain. He is not aware of any liver problems that he might have had in the past. He states he has never had any gallbladder problems. He has never had any swelling like this before. He is not having any trouble swallowing or any trouble with acid reflux. He does report approximately a month ago he had some loose stools. He might have seen some blood in the stools but has not seen any lately or since. He is not having any fevers or any other gastrointestinal-related complaints. He does report some increasing weakness over the last several months and some shortness of breath with exertion. He seems to remember having a couple of colonoscopies and an EGD in the past. I cannot [facility] or [facility], although the patient seems to think that they were done here. In chart review, it does appear that on abdominal ultrasound last year there was a question of gallbladder wall thickening and fatty pancreas. CT last February demonstrated some ascites, a  distended gallbladder, but no increase in liver or spleen size. The pancreas was unremarkable at this time. ALT has been consistently elevated over the last several months. Some isoenzymes were also obtained, and these were normal. Ultrasound this visit did show ascites, no hepatic mass,  biphasic blood flow, no gallstones, a normal spleen, some gallbladder wall thickening and a normal common biliary duct. ASMA, AMA and  ceruloplasmin labs are pending. There is a notation of a negative ANA from this year. There is also an indication of negative hepatitis B and C serologies. He denies family history of liver disease.   PAST MEDICAL HISTORY:  1. Coronary artery disease with coronary artery bypass grafting in 1981.  2. Pacemaker placement in 1988.  3. Hyperlipidemia.  4. Prosthetic hypertrophy. 5. Gout. 6. Anemia. 7. Thrombocytopenia.  8. Has had previous Oncology evaluation in past.  9. Chronic kidney disease, stage 3 to 4, presently sees [facility].  10. History of skin cancers. 11. Right inguinal hernia repair. 12. Valvular heart disease.  13. Echocardiogram in November 2012 with ejection fraction of 50%. 14. Moderate mitral and tricuspid regurgitation.   MEDICATIONS:  1. Allopurinol 300 mg p.o. daily, recently held.  2. Simvastatin 40 mg p.o. at bedtime, recently held. 3. Enteric-coated aspirin 81 mg p.o. daily.  4. Lopressor 50 mg p.o. daily.  5. Levothyroxine 0.025 mg p.o. daily.  6. Flonase 2 sprays each naris daily.  7. Kenalog topically p.r.n.  8. Lasix 20 mg p.o. b.i.d.  9. Iron supplement daily.  SOCIAL HISTORY: Remote tobacco. He states that he drinks about a fifth of liquor between Thursdays and Saturdays. No illicits.   FAMILY HISTORY: Significant for coronary artery disease. He denies family history of colorectal cancer, liver disease and peptic ulcer disease.   REVIEW OF SYSTEMS: Review of systems is difficult to obtain. Per the History and Physical,  additionally there is no complaint of fevers, chills, night sweats, chest pain. He also indicates not sleeping well, shortness of breath at times, and a rash that itches. No abdominal pain or nausea. He has no other complaints other than what is noted here.   LABORATORY, DIAGNOSTIC AND RADIOLOGICAL DATA:  Most recent lab work: Serum glucose 68, BUN 65, creatinine 3.15, sodium 150, potassium 3.9, chloride 118, GFR 16, calcium 8.4, total serum protein 6.6, albumin 2.2, total bilirubin 0.3, alkaline phosphatase 214, direct bilirubin 0.7, AST 49, ALT 21.  TSH 19.8.  WBC 4.8, hemoglobin 10.6, hematocrit 32.5, platelet count 100, normocytic, increased RDW. PT 16.7, INR 13.   PHYSICAL EXAMINATION:  VITAL SIGNS: Most recent vital signs: Temperature 97.4, pulse 60, respiratory rate 18, blood pressure 98/60, oxygen saturation 90% on room air. Cardiac rhythm has been paced.   GENERAL: Elderly male, chronically ill-appearing, in no acute distress.   PSYCHIATRIC: Somewhat confused, able to answer some questions, other questions he does not answer and will talk about the subject without actually answering the questions. He is cooperative and pleasant. Memory skills are not good.   HEENT: Normocephalic, atraumatic. Sclerae are anicteric. No redness, drainage, or inflammation to the eyes or nares. Oral mucous membranes pink and moist.   NECK: No thyromegaly, adenopathy or JVD.   CARDIOVASCULAR: S1, S2. Very mild systolic murmur. No gallops. Peripheral pulses 1+. Regular rate and rhythm. Edema is noted from the abdominal wall cavity down throughout the rest of the body.   LUNGS: Respirations are eupneic, faint expiratory wheezing noted bilaterally.   ABDOMEN: Thickening of the abdominal wall. Edema is noted. Some hyperpigmentation. Nondistended, active bowel sounds x4, soft, nontender.   GENITOURINARY: Noted penile and scrotal swelling. Normal male genitalia otherwise.   SKIN: No jaundice or discoloration  noted from lower chest superiorly; however, inferiorly to the lower chest wall there is diffuse hyperpigmentation extending down both legs. The legs are somewhat brawny and scaling. There are a few telangiectasias on the face. There is no other rash.   MUSCULOSKELETAL: Generalized weakness. He is a maximum assist for stand-pivot transfers. No clubbing or cyanosis. Edema as noted.   NEUROLOGICAL: Cranial nerves II through XII appear intact. He does move all extremities. Leg weakness as noted. Alert and oriented to self and hospital.  IMPRESSION AND PLAN: Anasarca with some elevated liver enzymes. This may be related to alcohol but also is concerning for cholestatic obstruction, possible cirrhosis. I will order ammonia and await pending labs. I will look for documentation of ANA and hepatitis serologies and ferritin, if unable to find I will reorder these. Further recommendations to follow. He may ultimately require liver biopsy for diagnosis confirmation. With questionable blood in stool last night, we will send  stool for occult blood as well.   These services were provided by Vevelyn Pat, MSN, [facility] in collaboration with Dr. Lutricia Feil.   ____________________________ Keturah Barre, NP chl:cbb D: 08/20/2011 19:00:33 ET T: 08/20/2011 19:18:53 ET JOB#: [id number]  cc: Keturah Barre, NP, <Dictator> Eustaquio Maize Makira Holleman FNP ELECTRONICALLY SIGNED 08/26/2011 8:32

## 2014-09-30 NOTE — Discharge Summary (Signed)
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 The patient had progressive deterioration in his renal function over the last 3 to 6 months with developing of anasarca. He had been seen as an outpatient by both his hematologist, Dr. Lorre NickGittin, and active management by his nephrologist, Dr. Cherylann RatelLateef. He had been seen by his cardiologist with known significant valvular cardiac disease with moderate mitral and tricuspid regurgitation. At the time of admission, he had significant fluid retention with anasarca and significant compromise renal function.   During the hospitalization, he had some delirium as well. He underwent hemodialysis. He was seen in consultation by both the nephrologist, his cardiologist, and his hematologist, and was seen by a vascular surgeon. He was eventually seen by palliative care due to his extreme disease. His thrombocytopenia continues to deteriorate. His anasarca improved with the dialysis. His ongoing her renal function continued to show only stabilization with the hemodialysis with the issue of hypotension making it difficult for the hemodialysis machine to operate.   Keeping with the patient's long-standing desire not to be prolonged in the event he was in a hopeless and terminal spiral condition, he did not wish to be prolonged and to allow for a natural death. His son was involved in this discussion and concurred.   LABS/STUDIES: Laboratory studies included a chest x-ray that showed a left pleural effusion.   CAT scan of the chest without  contrast showed bilateral mild pleural effusions, severe cardiomegaly, and coronary artery disease.  Ultrasound of the abdomen showed some ascites and bilateral effusions. No hepatic masses were seen. Portal vein flow appeared to be normal. The kidneys showed no hydronephrosis or obstructive disease.   At the time of admission, his glucose was 91, BUN 66, sodium 149, potassium 3.9, chloride 101, osmolality 315, calcium 8.9, LDL 96.7, uric acid 11.9, and bilirubin 1.3. White count 5300, hemoglobin 11.4. Direct Coombs was negative. INR was 1.3.   Urinalysis was unremarkable.   B12 level was therapeutic. Erythropoietin level was high at 347. HIV antibodies were less than 1. Immunoprotein electrophoresis: Immunoglobins were high. 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 Cortisol level was normal. Anti-smooth muscle antibody was 15 units. Mitochondrial antibody was 114.4 units. APTT was 45.57 seconds. Ceruloplasmin was 44.7.   Blood gases: pH 7.29, CO2 21, and pO2 64.   Last laboratory studies on 03/21.2913: Sugar 145, BUN 44, creatinine 3.01, sodium 144, and potassium 3.5.   DISPOSITION: After multiple assessments, it was felt that he was more hospice home appropriate and was transferred to the hospice home.   DISCHARGE DIAGNOSES:  1. Acute on chronic renal failure with progression of disease. 2. Monoclonal gammopathy.  3. End-stage valvular cardiomyopathy. 4. Ischemic heart disease.  5. Sick sinus syndrome. 6. Gout.  7. Anemia with thrombocytopenia probably related to monoclonal gammopathy. 8. Delirium secondary to multiple comorbidity issues.  ____________________________ Walter Mollyon C. Candelaria Stagershaplin, MD dcc:slb D: 2011-10-19 08:03:08 ET     T: 2011-10-19 10:22:13 ET        JOB#: [id number] cc: Jatin Naumann C. Candelaria Stagershaplin, MD, <Dictator> Ryliee Figge C Janei Scheff MD ELECTRONICALLY  SIGNED 09/01/2011 6:52

## 2014-09-30 NOTE — Consult Note (Signed)
Pt getting dialysis. Liver serologies are neg so far. Continue lactulose for now. Will check back on Monday. If there are GI questions, call GI on call. Thanks..  Electronic Signatures: Lutricia Feilh, Gregoria Selvy (MD)  (Signed on 15-Mar-13 12:20)  Authored  Last Updated: 15-Mar-13 12:20 by Lutricia Feilh, Omara Alcon (MD)

## 2014-09-30 NOTE — Consult Note (Signed)
Chief Complaint:   Subjective/Chief Complaint Somewhat lethargic today. No specific complaints though renal function worsening.   VITAL SIGNS/ANCILLARY NOTES: **Vital Signs.:   14-Mar-13 14:08   Pulse Pulse 60   Pulse source per Dinamap   Respirations Respirations 22   Systolic BP Systolic BP 92   Diastolic BP (mmHg) Diastolic BP (mmHg) 56   Mean BP 68   BP Source manual   Pulse Ox % Pulse Ox % 90   Pulse Ox Activity Level  At rest   Oxygen Delivery 2L   Brief Assessment:   Cardiac Regular    Respiratory clear BS    Gastrointestinal distended but nontender   Routine Hem:  14-Mar-13 04:36    WBC (CBC) 6.9   RBC (CBC) 3.28   Hemoglobin (CBC) 10.8   Hematocrit (CBC) 33.1   Platelet Count (CBC) 106   MCV 101   MCH 32.8   MCHC 32.6   RDW 19.5   Neutrophil % 74.7   Lymphocyte % 14.0   Monocyte % 7.9   Eosinophil % 2.7   Basophil % 0.7   Neutrophil # 5.1   Lymphocyte # 1.0   Monocyte # 0.5   Eosinophil # 0.2   Basophil # 0.0  Routine Chem:  14-Mar-13 04:36    Glucose, Serum 97   BUN 69   Creatinine (comp) 4.13   Sodium, Serum 147   Potassium, Serum 4.1   Chloride, Serum 114   CO2, Serum 13   Calcium (Total), Serum 8.3   Osmolality (calc) 312   eGFR (African American) 18   eGFR (Non-African American) 15   Anion Gap 20   Ferritin ([facility]) 100   Iron Binding Capacity (TIBC) 272   Unbound Iron Binding Capacity 190   Iron, Serum 82   Iron Saturation 30   Phosphorus, Serum 6.0   Assessment/Plan:  Assessment/Plan:   Assessment LFT elevation. Some ascites. Ammonia level elevated.    Plan Will start low dose lactulose.   Electronic Signatures: Verdie Shire (MD)  (Signed 14-Mar-13 16:11)  Authored: Chief Complaint, VITAL SIGNS/ANCILLARY NOTES, Brief Assessment, Lab Results, Assessment/Plan   Last Updated: 14-Mar-13 16:11 by Verdie Shire (MD)

## 2014-09-30 NOTE — Consult Note (Signed)
PATIENT NAME:  [name], [name] MR#:  [id number] DATE OF BIRTH:  1925-06-23  DATE OF CONSULTATION:  08/20/2011  CONSULTING PHYSICIAN:  Annice NeedyJason S. Zulema Pulaski, MD  REASON FOR CONSULTATION: Dialysis catheter placement.   HISTORY OF PRESENT ILLNESS: The patient is an 81 year old white male with worsening edema, anemia and thrombocytopenia. He has had worsening renal failure over the past several months and is now in acute renal failure and progressed to need for dialysis. He is quite confused and a very poor historian. This history is obtained from the previous medical record.   PAST MEDICAL HISTORY:  1. Coronary disease.  2. Pacemaker placement.  3. Hyperlipidemia.  4. Benign prostatic hypertrophy.  5. Anemia and thrombocytopenia.  6. Stage 3 to 4 chronic kidney disease, now with acute renal failure.  7. Skin cancer.  8. Valvular heart disease.   PAST SURGICAL HISTORY:  1. Pacemaker placement.  2. Coronary artery bypass grafting.  3. Skin cancer removal.   ALLERGIES: No known drug allergies.   HOME MEDICATIONS:  1. Allopurinol 300 mg daily.  2. Simvastatin 40 mg at bedtime.  3. Aspirin 81 mg daily.  4. Lopressor 50 mg b.i.d.  5. Levothyroxine 0.025 mg daily.  6. Flonase 2 nasal sprays daily.  7. Kenalog topically as needed.  8. Lasix 20 mg p.o. b.i.d.  9. Iron daily.   SOCIAL HISTORY: Tobacco: Remote tobacco use. Alcohol: Apparently drinks a fifth of liquor per weekend.   FAMILY HISTORY: Positive for heart disease and hypertension.   REVIEW OF SYSTEMS: Review of systems is really not obtainable as he is quite confused.   PHYSICAL EXAMINATION:  GENERAL: This is a frail elderly-appearing white male, not in apparent distress.   VITAL SIGNS: Temperature 97.5, pulse 60, blood pressure 92/56, saturations are 90% on 2 liters nasal cannula.   HEENT: Head: Normocephalic and atraumatic. Eyes: Sclerae are anicteric. Conjunctivae are clear. Ears: Normal external appearance. Hearing appears  decreased.   NECK: Supple without adenopathy or jugular venous distention.   HEART: Somewhat irregular with a murmur present.   LUNGS: Diminished in the bases bilaterally.   ABDOMEN: Soft, nondistended, nontender.   EXTREMITIES: Moderate lower extremity edema with moderate-to-severe stasis changes to the lower extremities bilaterally.   NEUROLOGICAL: Confused, does not appear to have focal arm or leg weakness or numbness.   SKIN: Warm and dry.   LABORATORY, DIAGNOSTIC AND RADIOLOGICAL DATA:  Sodium 147, potassium 4.1, chloride 114, CO2 13, BUN 69, creatinine 4.1, glucose 97.  White blood cell count 6.9, hemoglobin 10.8, platelet count is 106,000.    ASSESSMENT AND PLAN: This is an 81 year old white male with worsening renal failure who now requires dialysis. A temporary dialysis catheter will be placed at the bedside.   This is a level-3 consultation   ____________________________ Annice NeedyJason S. Exander Shaul, MD jsd:cbb D: 08/20/2011 16:44:52 ET T: 08/20/2011 17:33:01 ET JOB#: [id number]  cc: Annice NeedyJason S. Kanetra Ho, MD, <Dictator> Annice NeedyJASON S Alrick Cubbage MD ELECTRONICALLY SIGNED 08/21/2011 16:27

## 2014-09-30 NOTE — Consult Note (Signed)
Patient with worsening acute renal failure.  Needs temp cath placed for immediate dialysis.  Will do at bedside.  Electronic Signatures: Annice Needyew, Danton Palmateer S (MD)  (Signed on 14-Mar-13 16:20)  Authored  Last Updated: 14-Mar-13 16:20 by Annice Needyew, Lorien Shingler S (MD)
